# Patient Record
Sex: Male | Born: 1970 | Race: White | Hispanic: No | Marital: Single | Smoking: Never smoker
Health system: Southern US, Community
[De-identification: ages and names within clinical notes are randomized; demographics above are authoritative.]

## PROBLEM LIST (undated history)

## (undated) DIAGNOSIS — G4730 Sleep apnea, unspecified: Secondary | ICD-10-CM

## (undated) DIAGNOSIS — I10 Essential (primary) hypertension: Secondary | ICD-10-CM

## (undated) DIAGNOSIS — E669 Obesity, unspecified: Secondary | ICD-10-CM

## (undated) DIAGNOSIS — R809 Proteinuria, unspecified: Secondary | ICD-10-CM

## (undated) DIAGNOSIS — L720 Epidermal cyst: Secondary | ICD-10-CM

## (undated) DIAGNOSIS — E785 Hyperlipidemia, unspecified: Secondary | ICD-10-CM

## (undated) HISTORY — PX: WISDOM TOOTH EXTRACTION: SHX21

## (undated) HISTORY — PX: TUMOR EXCISION: SHX421

---

## 2015-02-13 ENCOUNTER — Ambulatory Visit
Admit: 2015-02-13 | Disposition: A | Discharge status: Home / Self Care | Payer: Self-pay | Attending: Family Medicine | Admitting: Family Medicine

## 2015-11-15 IMAGING — CR RIGHT FOOT COMPLETE - 3+ VIEW
3 series · 3 of 3 positions shown · non-contrast
Comparison: None.

CLINICAL DATA: Dorsal right foot pain and tenderness after stepping
into a drain 10 days ago.

EXAM:
RIGHT FOOT COMPLETE - 3+ VIEW

[foot ap]
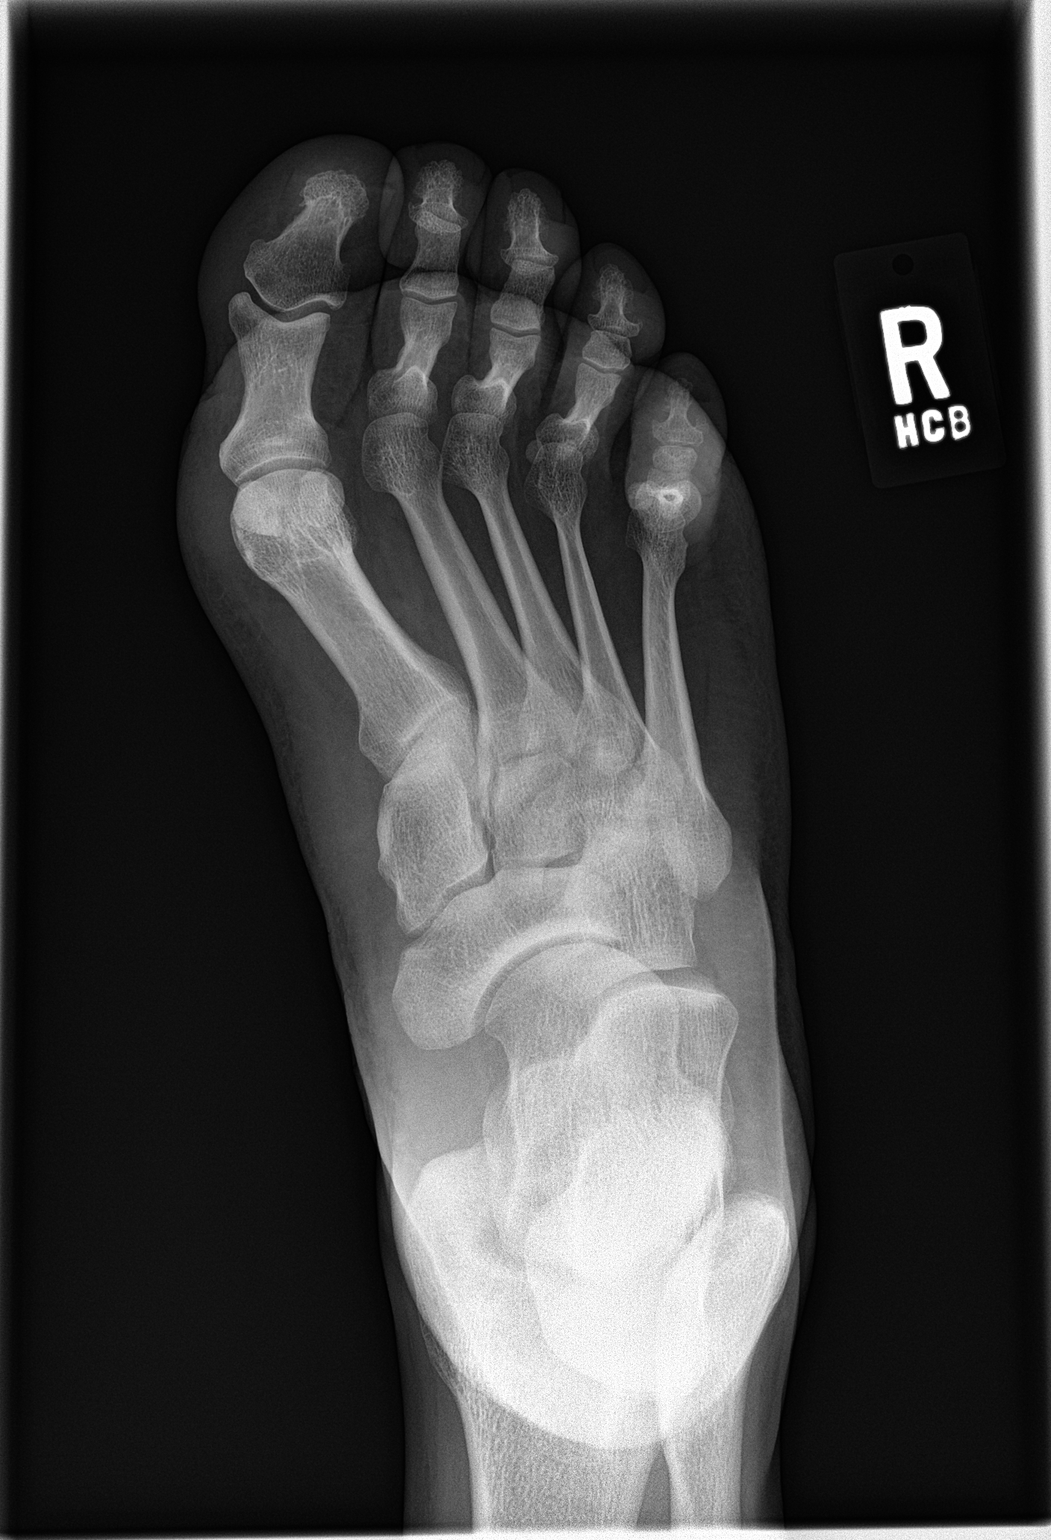

[foot obl]
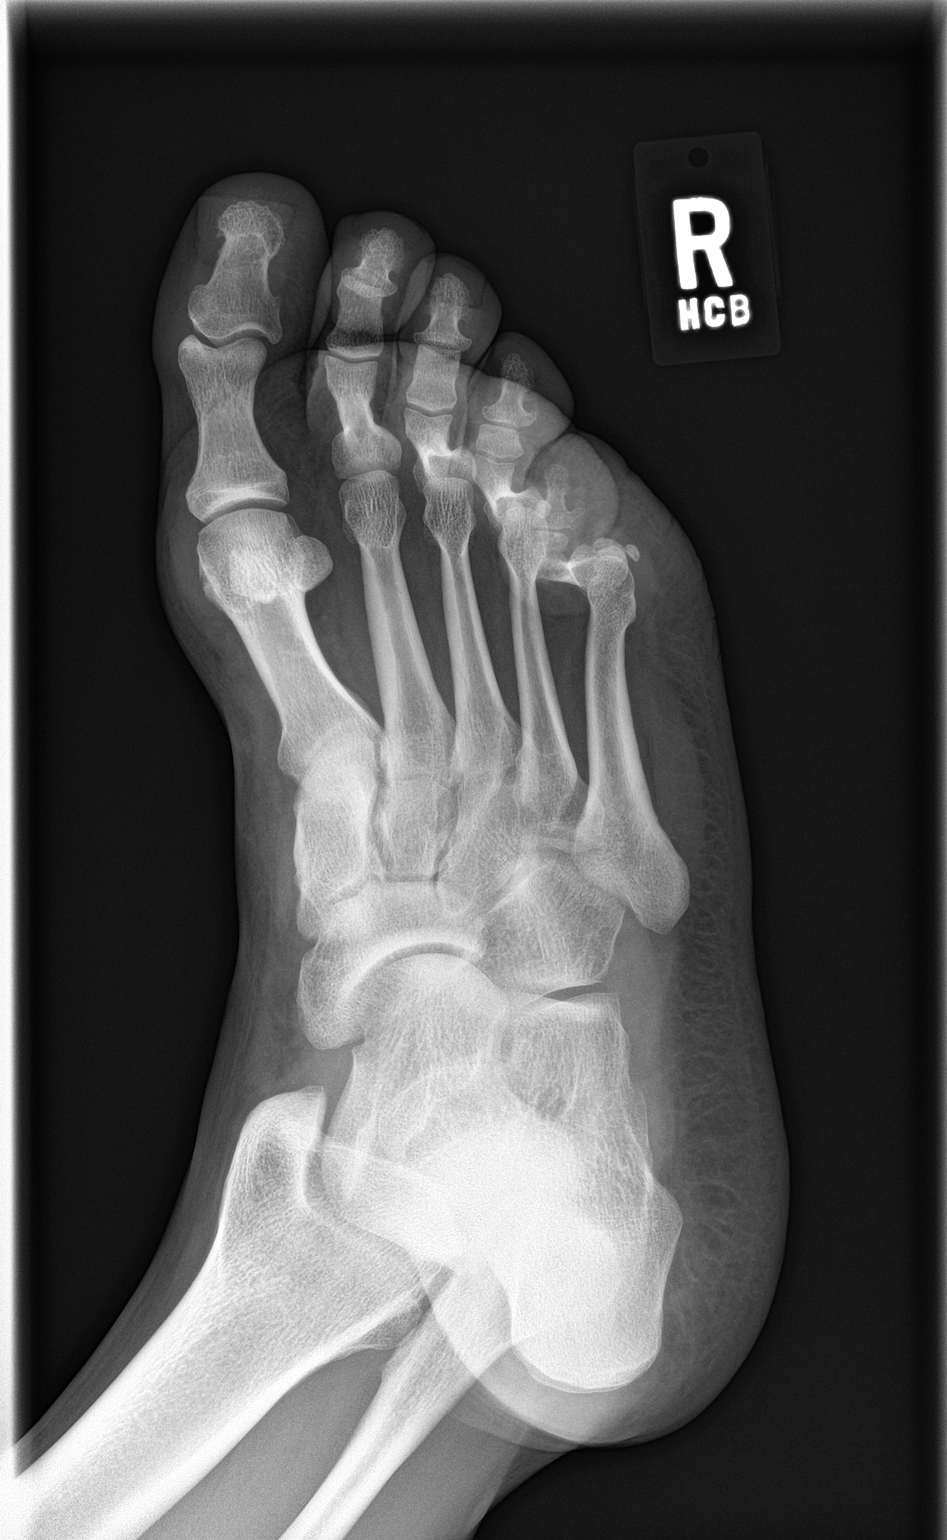

[foot lat]
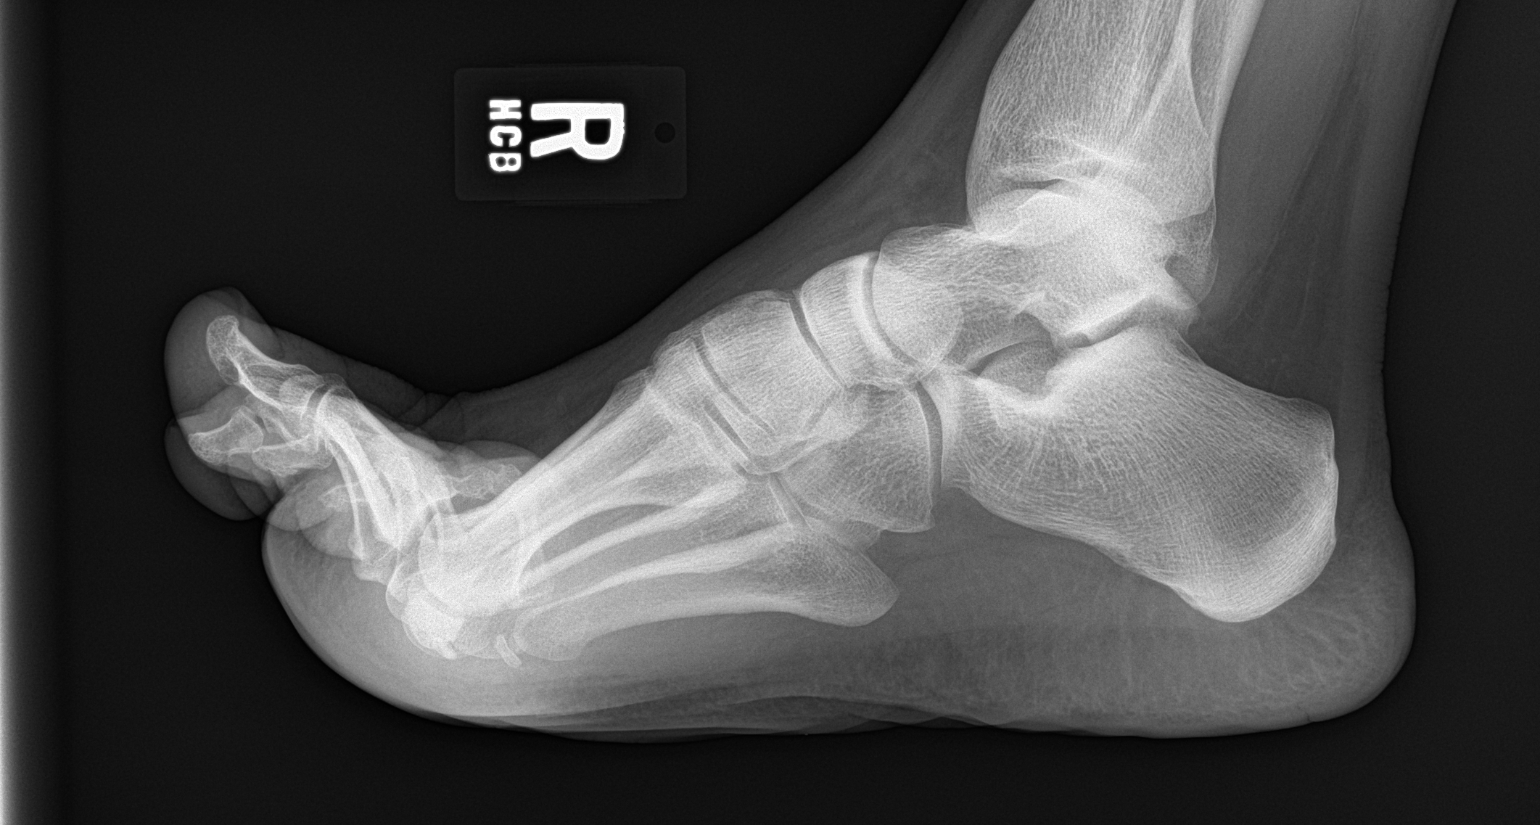

[3 of 3 positions shown; findings below may reference images not displayed]

FINDINGS: The toes are flexed at the MTP joints. This makes assessment of the
toes more difficult. No visible fracture or dislocation.
IMPRESSION: No fracture.

## 2020-01-21 ENCOUNTER — Ambulatory Visit: Payer: Self-pay | Attending: Internal Medicine

## 2020-01-21 DIAGNOSIS — Z23 Encounter for immunization: Secondary | ICD-10-CM

## 2020-01-21 NOTE — Progress Notes (Signed)
   Covid-19 Vaccination Clinic  Name:  Evan Griffin    MRN: [id number] DOB: 1971-04-19  01/21/2020  Mr. [name] was observed post Covid-19 immunization for 15 minutes without incident. He was provided with Vaccine Information Sheet and instruction to access the V-Safe system.   Mr. [name] was instructed to call 911 with any severe reactions post vaccine: Marland Kitchen Difficulty breathing  . Swelling of face and throat  . A fast heartbeat  . A bad rash all over body  . Dizziness and weakness   Immunizations Administered    Name Date Dose VIS Date Route   Pfizer COVID-19 Vaccine 01/21/2020  3:42 PM 0.3 mL 10/02/2019 Intramuscular   Manufacturer: [employer], [employer]   Lot: [id number]   NDC: [address]-3

## 2020-02-12 ENCOUNTER — Ambulatory Visit: Payer: Self-pay | Attending: Internal Medicine

## 2020-02-12 DIAGNOSIS — Z23 Encounter for immunization: Secondary | ICD-10-CM

## 2020-02-12 NOTE — Progress Notes (Signed)
   Covid-19 Vaccination Clinic  Name:  Evan Griffin    MRN: [id number] DOB: Aug 21, 1971  02/12/2020  [name] was observed post Covid-19 immunization for 15 minutes without incident. He was provided with Vaccine Information Sheet and instruction to access the V-Safe system.   [name] was instructed to call 911 with any severe reactions post vaccine: Marland Kitchen Difficulty breathing  . Swelling of face and throat  . A fast heartbeat  . A bad rash all over body  . Dizziness and weakness   Immunizations Administered    Name Date Dose VIS Date Route   Pfizer COVID-19 Vaccine 02/12/2020  3:19 PM 0.3 mL 12/16/2018 Intramuscular   Manufacturer: [employer], [employer]   Lot: [id number]   NDC: [address]-9

## 2021-05-29 ENCOUNTER — Ambulatory Visit
Admission: RE | Admit: 2021-05-29 | Discharge: 2021-05-29 | Disposition: A | Discharge status: Home / Self Care | Payer: Commercial Managed Care - PPO | Attending: Gastroenterology | Admitting: Gastroenterology

## 2021-05-29 ENCOUNTER — Ambulatory Visit: Payer: Commercial Managed Care - PPO | Admitting: Anesthesiology

## 2021-05-29 ENCOUNTER — Encounter
Admission: RE | Disposition: A | Discharge status: Home / Self Care | Payer: Self-pay | Source: Home / Self Care | Attending: Gastroenterology

## 2021-05-29 ENCOUNTER — Encounter: Payer: Self-pay | Admitting: *Deleted

## 2021-05-29 DIAGNOSIS — E785 Hyperlipidemia, unspecified: Secondary | ICD-10-CM | POA: Insufficient documentation

## 2021-05-29 DIAGNOSIS — K5730 Diverticulosis of large intestine without perforation or abscess without bleeding: Secondary | ICD-10-CM | POA: Insufficient documentation

## 2021-05-29 DIAGNOSIS — Z79899 Other long term (current) drug therapy: Secondary | ICD-10-CM | POA: Diagnosis not present

## 2021-05-29 DIAGNOSIS — I10 Essential (primary) hypertension: Secondary | ICD-10-CM | POA: Diagnosis not present

## 2021-05-29 DIAGNOSIS — Z1211 Encounter for screening for malignant neoplasm of colon: Secondary | ICD-10-CM | POA: Diagnosis present

## 2021-05-29 DIAGNOSIS — K635 Polyp of colon: Secondary | ICD-10-CM | POA: Diagnosis not present

## 2021-05-29 DIAGNOSIS — K760 Fatty (change of) liver, not elsewhere classified: Secondary | ICD-10-CM | POA: Diagnosis not present

## 2021-05-29 HISTORY — DX: Hyperlipidemia, unspecified: E78.5

## 2021-05-29 HISTORY — DX: Proteinuria, unspecified: R80.9

## 2021-05-29 HISTORY — DX: Essential (primary) hypertension: I10

## 2021-05-29 HISTORY — DX: Epidermal cyst: L72.0

## 2021-05-29 HISTORY — DX: Obesity, unspecified: E66.9

## 2021-05-29 HISTORY — DX: Sleep apnea, unspecified: G47.30

## 2021-05-29 HISTORY — PX: COLONOSCOPY WITH PROPOFOL: SHX5780

## 2021-05-29 SURGERY — COLONOSCOPY WITH PROPOFOL
Anesthesia: General

## 2021-05-29 MED ORDER — PROPOFOL 500 MG/50ML IV EMUL
INTRAVENOUS | Status: AC
  Filled 2021-05-29: qty 50

## 2021-05-29 MED ORDER — PHENYLEPHRINE HCL (PRESSORS) 10 MG/ML IV SOLN
INTRAVENOUS | Status: AC
  Filled 2021-05-29: qty 1

## 2021-05-29 MED ORDER — SODIUM CHLORIDE 0.9 % IV SOLN
INTRAVENOUS | Status: DC
  Administered 2021-05-29: 20 mL/h via INTRAVENOUS

## 2021-05-29 MED ORDER — PROPOFOL 500 MG/50ML IV EMUL
INTRAVENOUS | Status: DC | PRN
  Administered 2021-05-29: 150 ug/kg/min via INTRAVENOUS

## 2021-05-29 NOTE — Op Note (Signed)
[facility] Gastroenterology Patient Name: Evan Griffin Procedure Date: 05/29/2021 7:11 AM MRN: [id number] Account #: [id number] Date of Birth: [birth year] Admit Type: Outpatient Age: 50 Room: [facility] ENDO ROOM 3 Gender: Male Note Status: Finalized Procedure:             Colonoscopy Indications:           Screening for colorectal malignant neoplasm Providers:             Andrey Farmer MD, MD Medicines:             Monitored Anesthesia Care Complications:         No immediate complications. Estimated blood loss:                         Minimal. Procedure:             Pre-Anesthesia Assessment:                        - Prior to the procedure, a History and Physical was                         performed, and patient medications and allergies were                         reviewed. The patient is competent. The risks and                         benefits of the procedure and the sedation options and                         risks were discussed with the patient. All questions                         were answered and informed consent was obtained.                         Patient identification and proposed procedure were                         verified by the physician, the nurse, the anesthetist                         and the technician in the endoscopy suite. Mental                         Status Examination: alert and oriented. Airway                         Examination: normal oropharyngeal airway and neck                         mobility. Respiratory Examination: clear to                         auscultation. CV Examination: normal. Prophylactic                         Antibiotics: The patient does not require prophylactic  antibiotics. Prior Anticoagulants: The patient has                         taken no previous anticoagulant or antiplatelet                         agents. ASA Grade Assessment: II - A patient with mild                          systemic disease. After reviewing the risks and                         benefits, the patient was deemed in satisfactory                         condition to undergo the procedure. The anesthesia                         plan was to use monitored anesthesia care (MAC).                         Immediately prior to administration of medications,                         the patient was re-assessed for adequacy to receive                         sedatives. The heart rate, respiratory rate, oxygen                         saturations, blood pressure, adequacy of pulmonary                         ventilation, and response to care were monitored                         throughout the procedure. The physical status of the                         patient was re-assessed after the procedure.                        After obtaining informed consent, the colonoscope was                         passed under direct vision. Throughout the procedure,                         the patient's blood pressure, pulse, and oxygen                         saturations were monitored continuously. The                         Colonoscope was introduced through the anus and                         advanced to the the cecum, identified by appendiceal  orifice and ileocecal valve. The colonoscopy was                         performed without difficulty. The patient tolerated                         the procedure well. The quality of the bowel                         preparation was good. Findings:      The perianal and digital rectal examinations were normal.      A 1 mm polyp was found in the descending colon. The polyp was sessile.       The polyp was removed with a jumbo cold forceps. Resection and retrieval       were complete. Estimated blood loss was minimal.      A single small-mouthed diverticulum was found in the sigmoid colon.      The exam was otherwise without abnormality on direct  and retroflexion       views. Impression:            - One 1 mm polyp in the descending colon, removed with                         a jumbo cold forceps. Resected and retrieved.                        - Diverticulosis in the sigmoid colon.                        - The examination was otherwise normal on direct and                         retroflexion views. Recommendation:        - Discharge patient to home.                        - Resume previous diet.                        - Continue present medications.                        - Await pathology results.                        - Repeat colonoscopy for surveillance based on                         pathology results.                        - Return to referring physician as previously                         scheduled. Procedure Code(s):     --- Professional ---                        [phone], Colonoscopy, flexible; with biopsy, single or  multiple Diagnosis Code(s):     --- Professional ---                        Z12.11, Encounter for screening for malignant neoplasm                         of colon                        K63.5, Polyp of colon                        K57.30, Diverticulosis of large intestine without                         perforation or abscess without bleeding CPT copyright 2019 American Medical Association. All rights reserved. The codes documented in this report are preliminary and upon coder review may  be revised to meet current compliance requirements. Andrey Farmer MD, MD 05/29/2021 7:51:16 AM Number of Addenda: 0 Note Initiated On: 05/29/2021 7:11 AM Scope Withdrawal Time: 0 hours 11 minutes 25 seconds  Total Procedure Duration: 0 hours 15 minutes 59 seconds  Estimated Blood Loss:  Estimated blood loss was minimal.      [facility]

## 2021-05-29 NOTE — Anesthesia Procedure Notes (Signed)
Date/Time: 05/29/2021 7:35 AM Performed by: Tonia Ghent Pre-anesthesia Checklist: Patient identified, Emergency Drugs available, Suction available, Patient being monitored and Timeout performed Patient Re-evaluated:Patient Re-evaluated prior to induction Oxygen Delivery Method: Supernova nasal CPAP Preoxygenation: Pre-oxygenation with 100% oxygen Induction Type: IV induction Placement Confirmation: positive ETCO2 and CO2 detector

## 2021-05-29 NOTE — Transfer of Care (Signed)
Immediate Anesthesia Transfer of Care Note  Patient: Evan Griffin  Procedure(s) Performed: COLONOSCOPY WITH PROPOFOL  Patient Location: PACU  Anesthesia Type:General  Level of Consciousness: awake and sedated  Airway & Oxygen Therapy: Patient Spontanous Breathing and Patient connected to nasal cannula oxygen  Post-op Assessment: Report given to RN and Post -op Vital signs reviewed and stable  Post vital signs: Reviewed and stable  Last Vitals:  Vitals Value Taken Time  BP    Temp    Pulse    Resp    SpO2      Last Pain:  Vitals:   05/29/21 0658  TempSrc: Temporal  PainSc: 0-No pain         Complications: No notable events documented.

## 2021-05-29 NOTE — Interval H&P Note (Signed)
History and Physical Interval Note:  05/29/2021 7:26 AM  Evan Griffin  has presented today for surgery, with the diagnosis of screening.  The various methods of treatment have been discussed with the patient and family. After consideration of risks, benefits and other options for treatment, the patient has consented to  Procedure(s): COLONOSCOPY WITH PROPOFOL (N/A) as a surgical intervention.  The patient's history has been reviewed, patient examined, no change in status, stable for surgery.  I have reviewed the patient's chart and labs.  Questions were answered to the patient's satisfaction.     Regis Bill  Ok to proceed with colonoscopy

## 2021-05-29 NOTE — H&P (Signed)
Outpatient short stay form Pre-procedure 05/29/2021 7:24 AM Merlyn Lot MD, MPH  Primary Physician: [facility]  Reason for visit:  Screening  History of present illness:   50 y/o gentleman with history of fatty liver, hypertension, and hyperlipidemia here for screening colonoscopy. No blood thinners. No abdominal surgeries. No family history of GI malignancies.    Current Facility-Administered Medications:    0.9 %  sodium chloride infusion, , Intravenous, Continuous, Yilin Weedon, Rossie Muskrat, MD, Last Rate: 20 mL/hr at 05/29/21 0717, Continued from Pre-op at 05/29/21 0717  Medications Prior to Admission  Medication Sig Dispense Refill Last Dose   amLODipine (NORVASC) 10 MG tablet Take 10 mg by mouth daily.   05/28/2021   atorvastatin (LIPITOR) 10 MG tablet Take 20 mg by mouth daily.   05/28/2021     Not on File   Past Medical History:  Diagnosis Date   EIC (epidermal inclusion cyst)    Hyperlipidemia    Hypertension    Obesity    Protein in urine    Sleep apnea     Review of systems:  Otherwise negative.    Physical Exam  Gen: Alert, oriented. Appears stated age.  HEENT: PERRLA. Lungs: No respiratory distress CV: RRR Abd: soft, benign, no masses Ext: No edema    Planned procedures: Proceed with colonoscopy. The patient understands the nature of the planned procedure, indications, risks, alternatives and potential complications including but not limited to bleeding, infection, perforation, damage to internal organs and possible oversedation/side effects from anesthesia. The patient agrees and gives consent to proceed.  Please refer to procedure notes for findings, recommendations and patient disposition/instructions.     Merlyn Lot MD, MPH Gastroenterology 05/29/2021  7:24 AM

## 2021-05-29 NOTE — Anesthesia Postprocedure Evaluation (Signed)
Anesthesia Post Note  Patient: Evan Griffin  Procedure(s) Performed: COLONOSCOPY WITH PROPOFOL  Patient location during evaluation: PACU Anesthesia Type: General Level of consciousness: awake and alert Pain management: pain level controlled Vital Signs Assessment: post-procedure vital signs reviewed and stable Respiratory status: spontaneous breathing, nonlabored ventilation, respiratory function stable and patient connected to nasal cannula oxygen Cardiovascular status: blood pressure returned to baseline and stable Postop Assessment: no apparent nausea or vomiting Anesthetic complications: no   No notable events documented.   Last Vitals:  Vitals:   05/29/21 0753 05/29/21 0803  BP: (!) 95/50 (!) 95/54  Pulse: 66 (!) 53  Resp: (!) 9 (!) 9  Temp: (!) 35.8 C   SpO2: 100% 100%    Last Pain:  Vitals:   05/29/21 0803  TempSrc:   PainSc: 0-No pain                 Yevette Edwards

## 2021-05-29 NOTE — Anesthesia Preprocedure Evaluation (Signed)
Anesthesia Evaluation  Patient identified by MRN, date of birth, ID band Patient awake    Reviewed: Allergy & Precautions, H&P , NPO status , Patient's Chart, lab work & pertinent test results, reviewed documented beta blocker date and time   Airway Mallampati: II   Neck ROM: full    Dental  (+) Poor Dentition   Pulmonary sleep apnea ,    Pulmonary exam normal        Cardiovascular Exercise Tolerance: Poor hypertension, On Medications negative cardio ROS Normal cardiovascular exam Rhythm:regular Rate:Normal     Neuro/Psych negative neurological ROS  negative psych ROS   GI/Hepatic negative GI ROS, Neg liver ROS,   Endo/Other  negative endocrine ROS  Renal/GU negative Renal ROS  negative genitourinary   Musculoskeletal   Abdominal   Peds  Hematology negative hematology ROS (+)   Anesthesia Other Findings Past Medical History: No date: EIC (epidermal inclusion cyst) No date: Hyperlipidemia No date: Hypertension No date: Obesity No date: Protein in urine No date: Sleep apnea Past Surgical History: No date: TUMOR EXCISION     Comment:  soft tissue of face/scalp No date: WISDOM TOOTH EXTRACTION BMI    Body Mass Index: 37.61 kg/m     Reproductive/Obstetrics negative OB ROS                             Anesthesia Physical Anesthesia Plan  ASA: 3  Anesthesia Plan: General   Post-op Pain Management:    Induction:   PONV Risk Score and Plan:   Airway Management Planned:   Additional Equipment:   Intra-op Plan:   Post-operative Plan:   Informed Consent: I have reviewed the patients History and Physical, chart, labs and discussed the procedure including the risks, benefits and alternatives for the proposed anesthesia with the patient or authorized representative who has indicated his/her understanding and acceptance.     Dental Advisory Given  Plan Discussed with:  CRNA  Anesthesia Plan Comments:         Anesthesia Quick Evaluation

## 2021-05-30 LAB — SURGICAL PATHOLOGY

## 2023-05-15 ENCOUNTER — Ambulatory Visit: Payer: Commercial Managed Care - PPO | Admitting: Urology

## 2023-05-29 ENCOUNTER — Encounter: Payer: Self-pay | Admitting: Urology

## 2023-05-29 ENCOUNTER — Ambulatory Visit: Payer: Commercial Managed Care - PPO | Admitting: Urology

## 2023-05-29 VITALS — BP 134/86 | HR 83 | Ht 66.0 in | Wt 233.0 lb

## 2023-05-29 DIAGNOSIS — R972 Elevated prostate specific antigen [PSA]: Secondary | ICD-10-CM | POA: Diagnosis not present

## 2023-05-29 NOTE — Progress Notes (Signed)
    I, Evan Griffin, acting as a scribe for Evan Altes, MD., have documented all relevant documentation on the behalf of Evan Altes, MD, as directed by Evan Altes, MD while in the presence of Evan Altes, MD.  05/29/2023 10:35 AM   Evan Griffin 1970-12-02 [id number]  Referring provider: Rayetta Humphrey, MD [address] [place],  [place] [address]  Chief Complaint  Patient presents with   Elevated PSA    HPI: Evan Griffin is a 52 y.o. male referred for evaluation of an elevated PSA.   PSA drawn 04/04/23 was 1.81 Prior PSA March 2023 was 0.66, August 2021 0.49  No bothersome lower urinary tract symptoms. No family history of prostate cancer.   PMH: Past Medical History:  Diagnosis Date   EIC (epidermal inclusion cyst)    Hyperlipidemia    Hypertension    Obesity    Protein in urine    Sleep apnea     Surgical History: Past Surgical History:  Procedure Laterality Date   COLONOSCOPY WITH PROPOFOL N/A 05/29/2021   Procedure: COLONOSCOPY WITH PROPOFOL;  Surgeon: Regis Bill, MD;  Location: [facility] ENDOSCOPY;  Service: Endoscopy;  Laterality: N/A;   TUMOR EXCISION     soft tissue of face/scalp   WISDOM TOOTH EXTRACTION      Home Medications:  Allergies as of 05/29/2023   No Known Allergies      Medication List        Accurate as of May 29, 2023 10:35 AM. If you have any questions, ask your nurse or doctor.          amLODipine 10 MG tablet Commonly known as: NORVASC Take 10 mg by mouth daily.   atorvastatin 10 MG tablet Commonly known as: LIPITOR Take 20 mg by mouth daily.        Allergies: No Known Allergies  Social History:  reports that he has never smoked. He has never used smokeless tobacco. No history on file for alcohol use and drug use.   Physical Exam: BP 134/86   Pulse 83   Ht 5\' 6"  (1.676 m)   Wt 233 lb (105.7 kg)   BMI 37.61 kg/m   Constitutional:  Alert and oriented, No acute  distress. HEENT: [place] AT, moist mucus membranes.  Trachea midline, no masses. Cardiovascular: No clubbing, cyanosis, or edema. Respiratory: Normal respiratory effort, no increased work of breathing. GI: Abdomen is soft, nontender, nondistended, no abdominal masses Skin: No rashes, bruises or suspicious lesions. Neurologic: Grossly intact, no focal deficits, moving all 4 extremities. Psychiatric: Normal mood and affect.   Assessment & Plan:    1. Abnormal PSA PSA is not elevated and we discussed abnormal PSA velocity is based on 3 successive readings.  Recommend a follow-up PSA in approximately 1 month. He was instructed to abstain from intercourse/ejaculation for 24 hours prior to this blood draw.  [facility] [address], [address] [place], [place] [address] [phone]

## 2023-06-25 ENCOUNTER — Other Ambulatory Visit: Payer: Self-pay | Admitting: *Deleted

## 2023-06-25 DIAGNOSIS — R972 Elevated prostate specific antigen [PSA]: Secondary | ICD-10-CM

## 2023-06-28 ENCOUNTER — Other Ambulatory Visit: Payer: Commercial Managed Care - PPO

## 2023-06-28 DIAGNOSIS — R972 Elevated prostate specific antigen [PSA]: Secondary | ICD-10-CM

## 2023-06-29 LAB — PSA: Prostate Specific Ag, Serum: 1.6 ng/mL (ref 0.0–4.0)

## 2023-07-01 ENCOUNTER — Other Ambulatory Visit: Payer: Self-pay | Admitting: *Deleted

## 2023-07-01 DIAGNOSIS — R972 Elevated prostate specific antigen [PSA]: Secondary | ICD-10-CM

## 2023-07-05 ENCOUNTER — Ambulatory Visit
Admission: RE | Admit: 2023-07-05 | Discharge: 2023-07-05 | Disposition: A | Discharge status: Home / Self Care | Payer: Commercial Managed Care - PPO | Source: Ambulatory Visit | Attending: Urology | Admitting: Urology

## 2023-07-05 DIAGNOSIS — R972 Elevated prostate specific antigen [PSA]: Secondary | ICD-10-CM | POA: Insufficient documentation

## 2023-07-05 MED ORDER — GADOBUTROL 1 MMOL/ML IV SOLN
10.0000 mL | Freq: Once | INTRAVENOUS | Status: AC | PRN
  Administered 2023-07-05: 10 mL via INTRAVENOUS

## 2024-01-06 ENCOUNTER — Other Ambulatory Visit: Payer: Self-pay | Admitting: *Deleted

## 2024-01-06 DIAGNOSIS — R972 Elevated prostate specific antigen [PSA]: Secondary | ICD-10-CM

## 2024-01-07 ENCOUNTER — Other Ambulatory Visit: Payer: Self-pay

## 2024-01-09 ENCOUNTER — Ambulatory Visit: Payer: Self-pay | Admitting: Urology

## 2024-01-30 ENCOUNTER — Encounter: Payer: Self-pay | Admitting: Urology

## 2024-01-30 ENCOUNTER — Ambulatory Visit: Admitting: Urology

## 2024-01-30 VITALS — BP 150/98 | HR 73 | Ht 66.0 in | Wt 233.0 lb

## 2024-01-30 DIAGNOSIS — R972 Elevated prostate specific antigen [PSA]: Secondary | ICD-10-CM

## 2024-01-30 NOTE — Progress Notes (Signed)
    I, Evan Griffin, acting as a scribe for Geraline Knapp, MD., have documented all relevant documentation on the behalf of Geraline Knapp, MD, as directed by Geraline Knapp, MD while in the presence of Geraline Knapp, MD.  01/30/2024 3:21 PM   Eller Gut 07-Jul-1971 [id number]  Referring provider: [facility] [address] [place],  [place] [address]  Chief Complaint  Patient presents with   Elevated PSA   Urologic history 1. Abnormal PSA velocity Seen 05/29/2023 for PSA of 1.81 with baseline PSA 0.5-0.6 Repeat PSA remained elevated above baseline at 1.6  Prostate MRI 07/05/23 showed 37-gram prostate, with no lesions suspicious for intermediate or higher-grade prostate cancer. Hazy low T2 signal in the right PZ felt to be inflammatory in etiology.  HPI: Evan Griffin is a 53 y.o. male presents for a six-month follow-up.   No problem since last visit. He states his PSA was checked in January by PCP, however on record review his PSA was not checked.  Last PSA 06/28/23 was 1.6.  No bothersome lower urinary tract symptoms.    PMH: Past Medical History:  Diagnosis Date   EIC (epidermal inclusion cyst)    Hyperlipidemia    Hypertension    Obesity    Protein in urine    Sleep apnea     Surgical History: Past Surgical History:  Procedure Laterality Date   COLONOSCOPY WITH PROPOFOL N/A 05/29/2021   Procedure: COLONOSCOPY WITH PROPOFOL;  Surgeon: Shane Darling, MD;  Location: [facility] ENDOSCOPY;  Service: Endoscopy;  Laterality: N/A;   TUMOR EXCISION     soft tissue of face/scalp   WISDOM TOOTH EXTRACTION      Home Medications:  Allergies as of 01/30/2024   No Known Allergies      Medication List        Accurate as of January 30, 2024  3:21 PM. If you have any questions, ask your nurse or doctor.          amLODipine 10 MG tablet Commonly known as: NORVASC Take 10 mg by mouth daily.   atorvastatin 10 MG tablet Commonly known as:  LIPITOR Take 20 mg by mouth daily.        Allergies: No Known Allergies  Social History:  reports that he has never smoked. He has never used smokeless tobacco. No history on file for alcohol use and drug use.   Physical Exam: BP (!) 150/98   Pulse 73   Ht 5\' 6"  (1.676 m)   Wt 233 lb (105.7 kg)   BMI 37.61 kg/m   Constitutional:  Alert and oriented, No acute distress. HEENT: [place] AT Respiratory: Normal respiratory effort, no increased work of breathing. Psychiatric: Normal mood and affect.  Assessment & Plan:    1. Abnormal PSA velocity Negative prostate MRI Recommend PSA today. He declined to have drawn and said he would contact PCP  I have reviewed the above documentation for accuracy and completeness, and I agree with the above.   Geraline Knapp, MD  [facility] [address], [address] [place], [place] [address] [phone]
